# Patient Record
Sex: Female | Born: 1975 | Race: Black or African American | Hispanic: No | Marital: Single | Smoking: Current every day smoker
Health system: Southern US, Community
[De-identification: ages and names within clinical notes are randomized; demographics above are authoritative.]

## PROBLEM LIST (undated history)

## (undated) DIAGNOSIS — I10 Essential (primary) hypertension: Secondary | ICD-10-CM

## (undated) HISTORY — PX: HIP SURGERY: SHX245

---

## 2009-08-18 ENCOUNTER — Emergency Department (HOSPITAL_COMMUNITY): Admission: EM | Admit: 2009-08-18 | Discharge: 2009-08-19 | Payer: Self-pay | Admitting: Emergency Medicine

## 2009-10-21 ENCOUNTER — Emergency Department (HOSPITAL_COMMUNITY): Admission: EM | Admit: 2009-10-21 | Discharge: 2009-10-21 | Payer: Self-pay | Admitting: Emergency Medicine

## 2010-03-11 ENCOUNTER — Ambulatory Visit (HOSPITAL_COMMUNITY): Admission: RE | Admit: 2010-03-11 | Discharge: 2010-03-11 | Payer: Self-pay | Admitting: Obstetrics and Gynecology

## 2010-04-10 ENCOUNTER — Inpatient Hospital Stay (HOSPITAL_COMMUNITY): Admission: AD | Admit: 2010-04-10 | Discharge: 2010-04-10 | Payer: Self-pay | Admitting: Obstetrics and Gynecology

## 2010-05-02 ENCOUNTER — Inpatient Hospital Stay (HOSPITAL_COMMUNITY): Admission: AD | Admit: 2010-05-02 | Discharge: 2010-05-02 | Payer: Self-pay | Admitting: Obstetrics and Gynecology

## 2010-06-02 ENCOUNTER — Inpatient Hospital Stay (HOSPITAL_COMMUNITY)
Admission: AD | Admit: 2010-06-02 | Discharge: 2010-06-06 | Payer: Self-pay | Source: Home / Self Care | Attending: Obstetrics and Gynecology | Admitting: Obstetrics and Gynecology

## 2010-06-03 ENCOUNTER — Encounter (INDEPENDENT_AMBULATORY_CARE_PROVIDER_SITE_OTHER): Payer: Self-pay | Admitting: Obstetrics and Gynecology

## 2010-07-04 ENCOUNTER — Encounter: Payer: Self-pay | Admitting: Obstetrics and Gynecology

## 2010-07-05 NOTE — Discharge Summary (Signed)
NAME:  Nicole Neal, Nicole Neal            ACCOUNT NO.:  [id number]  MEDICAL RECORD NO.:  [id number]          PATIENT TYPE:  INP  LOCATION:  9131                          FACILITY:  [facility]  PHYSICIAN:  Naima A. Dillard, M.D. DATE OF BIRTH:  [birth year]  DATE OF ADMISSION:  06/02/2010 DATE OF DISCHARGE:  06/06/2010                              DISCHARGE SUMMARY   ADMITTING DIAGNOSES: 1. Intrauterine pregnancy at 38-6/7 weeks. 2. Advanced maternal age. 3. Chronic hypertension. 4. Elevated BMI. 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 6. Recent incarceration 7. Anemia. 8. Marijuana use.  DISCHARGE DIAGNOSES: 1. Intrauterine pregnancy at 40 weeks. 2. Chronic hypertension. 3. Fetal bradycardia. 4. Possible abruption. 5. Anemia without hemodynamic instability.  PROCEDURES:  Primary low transverse cesarean section.  HOSPITAL COURSE:  Nicole Neal is a 35 year old gravida 2, para 1-1-0-1 at 38-6/7 weeks who presented on the evening of June 02, 2010, for induction secondary to chronic hypertension with the patient on Aldomet.  Her pregnancy had been remarkable for; 1. Advanced maternal age with normal first trimester screen. 2. Chronic hypertension.  The patient on Aldomet 750 mg p.o. b.i.d. 3. Elevated BMI. 4. History of HSV II, but no recent or current lesions.  The patient     on Valtrex. 5. Recent incarceration 6. Anemia. 7. Marijuana use.  On arrival, the patient was closed, thick and -1 in terms of her cervix. She had no HSV lesions.  Cytotec was placed.  By the next morning, her cervix was 3, 70%, vertex, -2.  Artificial rupture of membranes was accomplished with clear fluid and had a variable deceleration.  At 12:50, a series of variable decelerations.  These responded to usual measures.  The patient was initially 5, then was 7 approximately 30 minutes later.  She then had a prolonged variable deceleration at 2:20 without any recovery.  She was then taken to the OR for a stat  cesarean section.  Findings were a viable female, weight 6 pounds 4 ounces, Apgars were 9 and 9.  There was a small area of clot, less than 10% of placenta.  Infant was taken to the full-term nursery.  Mother was taken to recovery in good condition.  The patient was 7-8 cm at the time of emergent C-section.  Postop day 1, patient was doing well.  She was up ad lib.  Her hemoglobin was 8.8 down from 11.8, white blood cell count was 13.6, her platelet count was 203.  Orthostatics were stable.  She did have a JP drain, was draining approximately 15 mL per shift.  Her blood pressures were in the 109/70.  Aldomet was being held secondary to lower blood pressure.  Social Work consult was obtained secondary to the patient's recent incarceration.  Rest of the patient's hospital stay was essentially uncomplicated.  Infant did well.  The patient was breastfeeding.  Aldomet was restarted.  Urine drug screen was done in light of the patient's previous history.  Results of this were not noted in the patient's chart.  By postop day #3, the patient was doing well. She was up ad lib.  She was on Aldomet 500 mg p.o. b.i.d.  Incision was clean, dry and intact.  Lochia was scant.  Her JP drain was removed without difficulty.  Blood pressure was 110/70.  The patient was afebrile.  She was deemed to receive full benefit of her hospital stay and was discharged home.  DISCHARGE INSTRUCTIONS:  Per [facility] handout.  DISCHARGE MEDICATIONS: 1. Motrin 600 mg p.o. q.6 h. p.r.n. pain. 2. Percocet 5/325 one to two p.o. q.3-4 h. p.r.n. pain. 3. Micronor 1 p.o. daily.  DISCHARGE FOLLOWUP:  Will occur in 6 weeks at [facility] or p.r.n.     Renaldo Reel Emilee Hero, C.N.M.   ______________________________ Pierre Bali Normand Sloop, M.D.    VLL/MEDQ  D:  06/25/2010  T:  06/26/2010  Job:  [id number]  Electronically Signed by Nigel Bridgeman C.N.M. on 06/29/2010 09:25:24 AM Electronically Signed by Jaymes Graff M.D. on 07/05/2010 02:59:55 PM

## 2010-08-23 LAB — CBC
HCT: 35 % — ABNORMAL LOW (ref 36.0–46.0)
Hemoglobin: 8.8 g/dL — ABNORMAL LOW (ref 12.0–15.0)
MCH: 25.2 pg — ABNORMAL LOW (ref 26.0–34.0)
MCH: 25.4 pg — ABNORMAL LOW (ref 26.0–34.0)
MCHC: 33.7 g/dL (ref 30.0–36.0)
MCHC: 33.7 g/dL (ref 30.0–36.0)
Platelets: 203 10*3/uL (ref 150–400)
Platelets: 253 10*3/uL (ref 150–400)
RBC: 4.68 MIL/uL (ref 3.87–5.11)
RDW: 16.1 % — ABNORMAL HIGH (ref 11.5–15.5)
RDW: 16.1 % — ABNORMAL HIGH (ref 11.5–15.5)

## 2010-08-23 LAB — RPR: RPR Ser Ql: NONREACTIVE

## 2010-08-25 LAB — STREP B DNA PROBE: Strep Group B Ag: NEGATIVE

## 2010-08-25 LAB — WET PREP, GENITAL: Trich, Wet Prep: NONE SEEN

## 2010-08-25 LAB — URINALYSIS, ROUTINE W REFLEX MICROSCOPIC
Glucose, UA: NEGATIVE mg/dL
Ketones, ur: 15 mg/dL — AB
Protein, ur: NEGATIVE mg/dL
Urobilinogen, UA: 0.2 mg/dL (ref 0.0–1.0)

## 2010-08-25 LAB — FETAL FIBRONECTIN: Fetal Fibronectin: NEGATIVE

## 2010-08-31 LAB — POCT I-STAT, CHEM 8
Calcium, Ion: 1.17 mmol/L (ref 1.12–1.32)
Creatinine, Ser: 0.5 mg/dL (ref 0.4–1.2)
Glucose, Bld: 90 mg/dL (ref 70–99)
Hemoglobin: 11.9 g/dL — ABNORMAL LOW (ref 12.0–15.0)
TCO2: 25 mmol/L (ref 0–100)

## 2010-08-31 LAB — GC/CHLAMYDIA PROBE AMP, GENITAL: Chlamydia, DNA Probe: NEGATIVE

## 2010-08-31 LAB — URINALYSIS, ROUTINE W REFLEX MICROSCOPIC
Bilirubin Urine: NEGATIVE
Glucose, UA: NEGATIVE mg/dL
Nitrite: NEGATIVE
Protein, ur: NEGATIVE mg/dL
pH: 6 (ref 5.0–8.0)

## 2010-08-31 LAB — CBC
Hemoglobin: 10.7 g/dL — ABNORMAL LOW (ref 12.0–15.0)
MCHC: 31.2 g/dL (ref 30.0–36.0)
MCV: 71.5 fL — ABNORMAL LOW (ref 78.0–100.0)
RBC: 4.8 MIL/uL (ref 3.87–5.11)

## 2010-08-31 LAB — DIFFERENTIAL
Basophils Relative: 1 % (ref 0–1)
Eosinophils Absolute: 0.1 10*3/uL (ref 0.0–0.7)
Monocytes Absolute: 0.8 10*3/uL (ref 0.1–1.0)
Monocytes Relative: 8 % (ref 3–12)

## 2010-09-06 LAB — POCT PREGNANCY, URINE: Preg Test, Ur: NEGATIVE

## 2010-09-06 LAB — URINE MICROSCOPIC-ADD ON

## 2010-09-06 LAB — URINALYSIS, ROUTINE W REFLEX MICROSCOPIC
Glucose, UA: NEGATIVE mg/dL
Hgb urine dipstick: NEGATIVE
Protein, ur: 30 mg/dL — AB

## 2010-09-06 LAB — CBC
Hemoglobin: 11.5 g/dL — ABNORMAL LOW (ref 12.0–15.0)
RBC: 5.55 MIL/uL — ABNORMAL HIGH (ref 3.87–5.11)
RDW: 21.2 % — ABNORMAL HIGH (ref 11.5–15.5)

## 2010-09-06 LAB — GLUCOSE, CAPILLARY: Glucose-Capillary: 140 mg/dL — ABNORMAL HIGH (ref 70–99)

## 2010-09-06 LAB — COMPREHENSIVE METABOLIC PANEL
ALT: 18 U/L (ref 0–35)
Alkaline Phosphatase: 100 U/L (ref 39–117)
Glucose, Bld: 146 mg/dL — ABNORMAL HIGH (ref 70–99)
Potassium: 4.4 mEq/L (ref 3.5–5.1)
Sodium: 137 mEq/L (ref 135–145)
Total Protein: 7.4 g/dL (ref 6.0–8.3)

## 2011-04-03 ENCOUNTER — Inpatient Hospital Stay (HOSPITAL_COMMUNITY)
Admission: AD | Admit: 2011-04-03 | Discharge: 2011-04-03 | Disposition: A | Discharge status: Home / Self Care | Payer: Medicaid Other | Source: Ambulatory Visit | Attending: Obstetrics and Gynecology | Admitting: Obstetrics and Gynecology

## 2011-04-03 ENCOUNTER — Encounter (HOSPITAL_COMMUNITY): Payer: Self-pay | Admitting: *Deleted

## 2011-04-03 DIAGNOSIS — E669 Obesity, unspecified: Secondary | ICD-10-CM

## 2011-04-03 DIAGNOSIS — I10 Essential (primary) hypertension: Secondary | ICD-10-CM

## 2011-04-03 DIAGNOSIS — O209 Hemorrhage in early pregnancy, unspecified: Secondary | ICD-10-CM | POA: Insufficient documentation

## 2011-04-03 DIAGNOSIS — IMO0002 Reserved for concepts with insufficient information to code with codable children: Secondary | ICD-10-CM

## 2011-04-03 HISTORY — DX: Essential (primary) hypertension: I10

## 2011-04-03 LAB — WET PREP, GENITAL
Trich, Wet Prep: NONE SEEN
Yeast Wet Prep HPF POC: NONE SEEN

## 2011-04-03 LAB — DIFFERENTIAL
Lymphocytes Relative: 30 % (ref 12–46)
Lymphs Abs: 2.7 10*3/uL (ref 0.7–4.0)
Neutro Abs: 5.1 10*3/uL (ref 1.7–7.7)
Neutrophils Relative %: 59 % (ref 43–77)

## 2011-04-03 LAB — CBC
MCV: 73.9 fL — ABNORMAL LOW (ref 78.0–100.0)
Platelets: 244 10*3/uL (ref 150–400)
RBC: 5.18 MIL/uL — ABNORMAL HIGH (ref 3.87–5.11)
WBC: 8.8 10*3/uL (ref 4.0–10.5)

## 2011-04-03 LAB — URINALYSIS, ROUTINE W REFLEX MICROSCOPIC
Bilirubin Urine: NEGATIVE
Ketones, ur: NEGATIVE mg/dL
Nitrite: NEGATIVE
Protein, ur: NEGATIVE mg/dL
Urobilinogen, UA: 0.2 mg/dL (ref 0.0–1.0)

## 2011-04-03 NOTE — ED Notes (Signed)
Patient G3P2 [redacted] weeks pregnant positive home test, woke up this morning with some bleeding mainly on toilet tissue, faintly positive UPT in MAU, CNM to put orders in and will come to evaluate patient in MAU

## 2011-04-03 NOTE — Progress Notes (Signed)
Pt reports having positive HPT and woke up this morning and had some vaginal bleeding when she wiped. Denies pain or cramping at this time. Reports having intercourse on Friday

## 2011-04-04 LAB — GC/CHLAMYDIA PROBE AMP, GENITAL: GC Probe Amp, Genital: NEGATIVE

## 2011-05-26 NOTE — ED Provider Notes (Signed)
History   Nicole Neal is a 35 y.o. Obese black female who presents w/ CC of vaginal bleeding this morning.  No cramping or abdominal pain before arriving to MAU, but onset after arrival.  Unplanned pregnancy, but welcomed.  No care prior to today's eval.  LMP=02/20/11.  Last intercourse Friday 04/01/11.  Faint positive UPT on arrival to MAU per RN.  No GI or resp c/o's.  No fever, malaise, chills.  No UTI s/s.  Hx r/f:  1.  Obese  2.  CHTN--no meds currently  3.  HSV II positive blood titers  4.  AMA  5.  H/o chlamydia in past  6.  [place] trait  7.  H/o hip fx  8.  H/o incarceration in past   Pt by herself in MAU.    Chief Complaint  Patient presents with  . Vaginal Bleeding   HPI  OB History    Grav Para Term Preterm Abortions TAB SAB Ect Mult Living   3 2 2       2       Past Medical History  Diagnosis Date  . Hypertension     No past surgical history on file.  No family history on file.  History  Substance Use Topics  . Smoking status: Former Games developer  . Smokeless tobacco: Never Used  . Alcohol Use:     Allergies: No Known Allergies  No prescriptions prior to admission    ROS--see HPI Physical Exam  ..Blood pressure 127/83, pulse 74, temperature 98.7 F (37.1 C), temperature source Oral, resp. rate 18, height 5\' 7"  (1.702 m), weight 145.605 kg (321 lb), last menstrual period 02/20/2011. Physical Exam  Constitutional: She is oriented to person, place, and time. She appears well-developed and well-nourished. No distress.  Cardiovascular: Normal rate.   Respiratory: Effort normal.  GI: Soft. Bowel sounds are normal.  Genitourinary:       SSE:  sm-mod amt of dk red blood in vault; cx:  Closed. No lesions noted.   Difficulty assessing ut size, adnexa secondary to habitus  Neurological: She is alert and oriented to person, place, and time.  Skin: Skin is warm and dry.    MAU Course  Procedures 1.  SSE 2.  Bimanual exam 3.  Wet Prep--moderate clue cells; otherwise  negative 4.  GC/CT--both negative 5.  U/a--WNL except lg Hgb on dip & SG=1.025 6.  CBC w/ diff--WNL:  WBC=8.8; Hgb=12.7; Hct=38.3; Platelets=244; 7.  Quant hcg=24  Assessment and Plan  1. Probable SAB/ low quant hcg =24 2.  7.1 weeks per LMP 3.  Vaginal bleeding 4.  B positive per previous PNR w/ last pregnancy 5.  Obese 6.  AMA 7.  CHTN 8.  BV on wet prep  1.  D/c home w/ bleeding and SAB precautions 2.  Will do f/u quant at office in 48 hrs 3.  Rx for Flagyl 500mg  po bid x7d; Tylenol for cramping 4.  F/u prn Maurico Perrell H 05/26/2011, 5:09 PM

## 2012-07-15 ENCOUNTER — Emergency Department (HOSPITAL_COMMUNITY)
Admission: EM | Admit: 2012-07-15 | Discharge: 2012-07-15 | Disposition: A | Discharge status: Home / Self Care | Payer: Medicaid Other | Attending: Emergency Medicine | Admitting: Emergency Medicine

## 2012-07-15 ENCOUNTER — Encounter (HOSPITAL_COMMUNITY): Payer: Self-pay | Admitting: Emergency Medicine

## 2012-07-15 ENCOUNTER — Emergency Department (HOSPITAL_COMMUNITY): Payer: Medicaid Other

## 2012-07-15 DIAGNOSIS — I10 Essential (primary) hypertension: Secondary | ICD-10-CM | POA: Insufficient documentation

## 2012-07-15 DIAGNOSIS — Y9289 Other specified places as the place of occurrence of the external cause: Secondary | ICD-10-CM | POA: Insufficient documentation

## 2012-07-15 DIAGNOSIS — Z87891 Personal history of nicotine dependence: Secondary | ICD-10-CM | POA: Insufficient documentation

## 2012-07-15 DIAGNOSIS — S83429A Sprain of lateral collateral ligament of unspecified knee, initial encounter: Secondary | ICD-10-CM | POA: Insufficient documentation

## 2012-07-15 DIAGNOSIS — Y9389 Activity, other specified: Secondary | ICD-10-CM | POA: Insufficient documentation

## 2012-07-15 DIAGNOSIS — W010XXA Fall on same level from slipping, tripping and stumbling without subsequent striking against object, initial encounter: Secondary | ICD-10-CM | POA: Insufficient documentation

## 2012-07-15 DIAGNOSIS — S8390XA Sprain of unspecified site of unspecified knee, initial encounter: Secondary | ICD-10-CM

## 2012-07-15 MED ORDER — OXYCODONE-ACETAMINOPHEN 5-325 MG PO TABS
2.0000 | ORAL_TABLET | Freq: Once | ORAL | Status: AC
  Administered 2012-07-15: 2 via ORAL
  Filled 2012-07-15: qty 2

## 2012-07-15 MED ORDER — OXYCODONE-ACETAMINOPHEN 5-325 MG PO TABS: ORAL_TABLET | ORAL | Status: AC

## 2012-07-15 NOTE — Progress Notes (Signed)
Orthopedic Tech Progress Note Patient Details:  Nicole Neal [birth year] [id number]  Ortho Devices Type of Ortho Device: Crutches;Knee Immobilizer Ortho Device/Splint Location: left leg Ortho Device/Splint Interventions: Application   [name] 07/15/2012, 9:42 PM

## 2012-07-15 NOTE — ED Notes (Signed)
Patient reports that she fell while feeding her dog tonight and injured her left knee.  Patient reports that she cannot put weight on her left leg due to the knee pain.

## 2012-07-15 NOTE — ED Notes (Signed)
Fall down feeding his dog. Stated it hurt

## 2012-07-15 NOTE — ED Provider Notes (Signed)
History   This chart was scribed for Wynetta Emery PA-C a non-physician practitioner working with Doug Sou, MD by Lewanda Rife, ED Scribe. This patient was seen in room TR05C/TR05C and the patient's care was started at 9:10 pm.   CSN: [id number]  Arrival date & time 07/15/12  1955   First MD Initiated Contact with Patient 07/15/12 2005      Chief Complaint  Patient presents with  . Knee Injury    (Consider location/radiation/quality/duration/timing/severity/associated sxs/prior treatment) HPI Nicole Neal is a 37 y.o. female who presents to the Emergency Department complaining of constant severe left knee pain onset prior to arrival. Pt reports she tripped over her dog and twisted her left knee. Pt denies any head injury, any other injuries, and numbness. Pt reports normal sensation in left foot. Pt states pain is aggravated when baring weight and movement, and alleviated by nothing. Pt denies taking any pain medications at home to relieve pain.    Past Medical History  Diagnosis Date  . Hypertension     Past Surgical History  Procedure Date  . Hip surgery     History reviewed. No pertinent family history.  History  Substance Use Topics  . Smoking status: Former Games developer  . Smokeless tobacco: Never Used  . Alcohol Use: 0.0 oz/week    4-6 drink(s) per week    OB History    Grav Para Term Preterm Abortions TAB SAB Ect Mult Living   3 2 2       2       Review of Systems  Constitutional: Negative.   HENT: Negative.   Respiratory: Negative.   Cardiovascular: Negative.   Gastrointestinal: Negative.   Musculoskeletal:       Left knee pain   Skin: Negative.   Neurological: Negative.   Hematological: Negative.   Psychiatric/Behavioral: Negative.   All other systems reviewed and are negative.   A complete 10 system review of systems was obtained and all systems are negative except as noted in the HPI and PMH.   Allergies  Review of patient's  allergies indicates no known allergies.  Home Medications   Current Outpatient Rx  Name  Route  Sig  Dispense  Refill  . PRENATAL PLUS 27-1 MG PO TABS   Oral   Take 1 tablet by mouth daily.             BP 136/90  Pulse 94  Temp 98.1 F (36.7 C) (Oral)  Resp 18  SpO2 98%  LMP 07/09/2012  Breastfeeding? Unknown  Physical Exam  Nursing note and vitals reviewed. Constitutional: She is oriented to person, place, and time. She appears well-developed and well-nourished. No distress.  HENT:  Head: Normocephalic.  Eyes: Conjunctivae normal and EOM are normal.  Cardiovascular: Normal rate.   Pulmonary/Chest: Effort normal. No stridor.  Musculoskeletal: Normal range of motion.       Left knee: She exhibits no LCL laxity and no MCL laxity. tenderness found. Patellar tendon tenderness noted. No medial joint line and no lateral joint line tenderness noted.       Mildly reduced ROM secondary to pain   Left Knee: No deformity, erythema or abrasions. No effusion or crepitance. Anterior and posterior drawer show no abnormal laxity. Stable to valgus and varus stress. Joint lines are non-tender. Neurovascularly intact. Pt ambulates with an antalgic gait.   Neurological: She is alert and oriented to person, place, and time.  Skin: Skin is warm and dry.  Psychiatric: She has a  normal mood and affect.    ED Course  Procedures (including critical care time)  Medications  oxyCODONE-acetaminophen (PERCOCET/ROXICET) 5-325 MG per tablet (not administered)  oxyCODONE-acetaminophen (PERCOCET/ROXICET) 5-325 MG per tablet 2 tablet (2 tablet Oral Given 07/15/12 2130)    Labs Reviewed - No data to display Dg Knee Complete 4 Views Left  07/15/2012  *RADIOLOGY REPORT*  Clinical Data: Anterior knee pain after fall.  LEFT KNEE - COMPLETE 4+ VIEW  Comparison: None.  Findings: Grid cut off artifact on the AP view.  There are degenerative changes in the left knee involving all three compartments with medial  compartment narrowing and patellofemoral joint hypertrophic changes most prominent.  There is a focal calcification projected over the posterior aspect of the lateral compartment which may represent loose body.  No focal donor site is identified.  No significant effusion.  No evidence of acute fracture or subluxation.  No radiopaque soft tissue foreign bodies.  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  Possible loose body in the lateral compartment.  No acute fractures.   Original Report Authenticated By: Burman Nieves, M.D.      1. Knee sprain       MDM  No Fracture or ligamentous transection. Pt will be given Knee immobilizer and Crutches.    Filed Vitals:   07/15/12 2003 07/15/12 2157  BP: 136/90   Pulse: 94   Temp: 98.1 F (36.7 C)   TempSrc: Oral   Resp: 18   SpO2: 98% 99%     Pt verbalized understanding and agrees with care plan. Outpatient follow-up and return precautions given.    New Prescriptions   OXYCODONE-ACETAMINOPHEN (PERCOCET/ROXICET) 5-325 MG PER TABLET    1 to 2 tabs PO q6hrs  PRN for pain    I personally performed the services described in this documentation, which was scribed in my presence. The recorded information has been reviewed and is accurate.    Wynetta Emery, PA-C 07/16/12 1229

## 2012-07-17 NOTE — ED Provider Notes (Signed)
Medical screening examination/treatment/procedure(s) were performed by non-physician practitioner and as supervising physician I was immediately available for consultation/collaboration.  Doug Sou, MD 07/17/12 1330

## 2014-04-14 ENCOUNTER — Encounter (HOSPITAL_COMMUNITY): Payer: Self-pay | Admitting: Emergency Medicine

## 2014-07-03 IMAGING — CR DG KNEE COMPLETE 4+V*L*
5 series · 5 of 5 positions shown · non-contrast
Comparison: None.

CLINICAL DATA: Anterior knee pain after fall.

LEFT KNEE - COMPLETE 4+ VIEW

[t knee ap left]
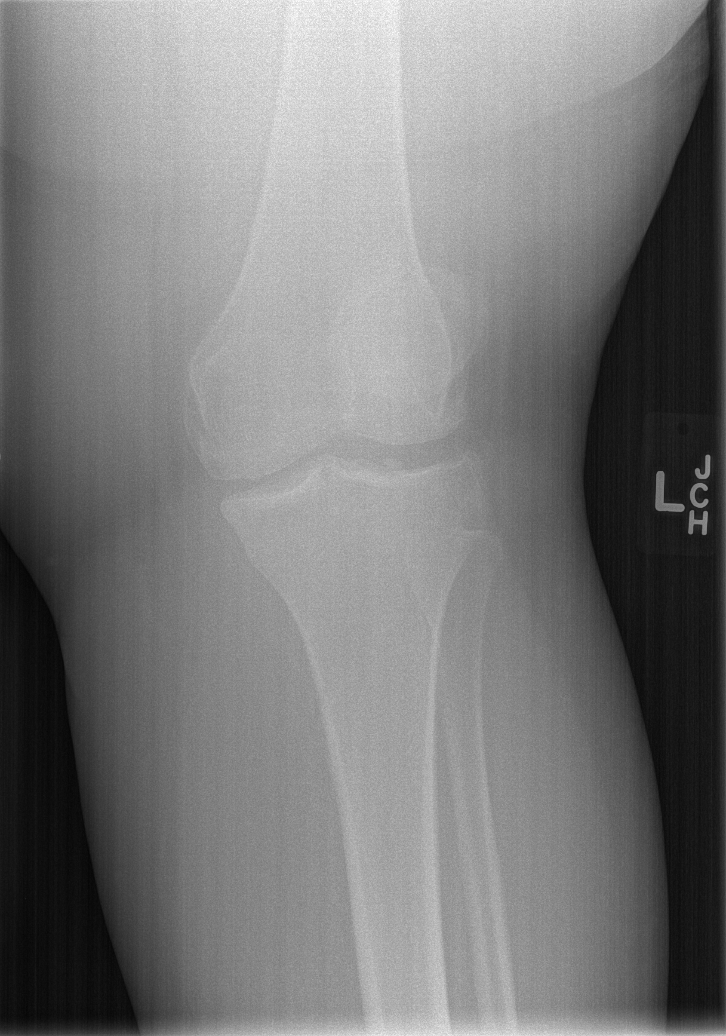

[t knee ap left *]
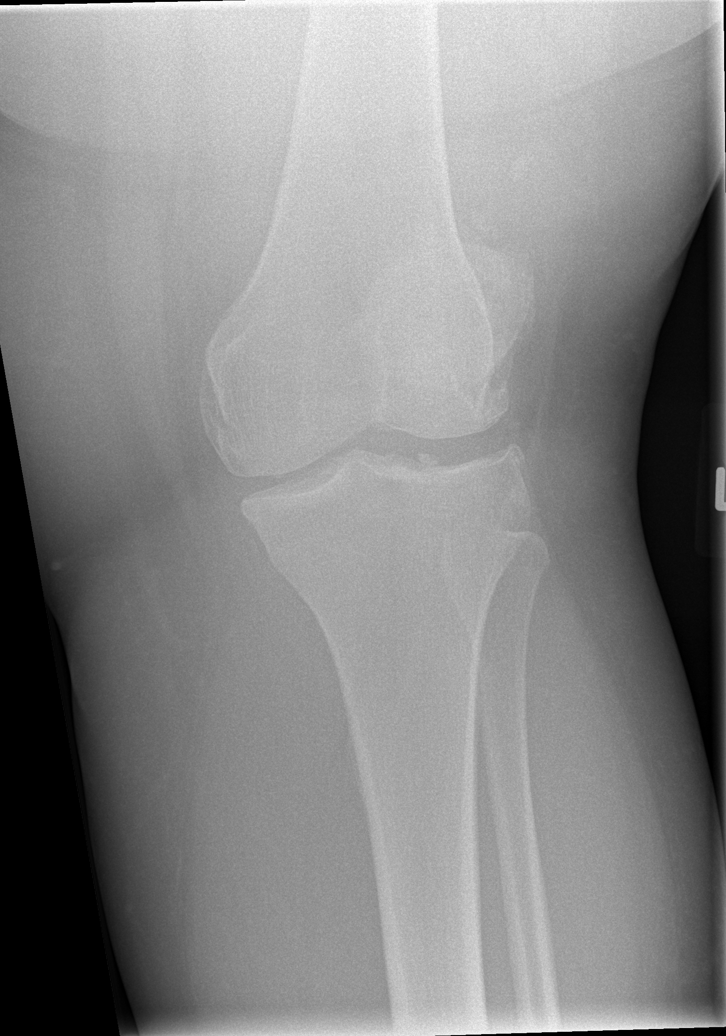

[t knee oblique left * (1 of 2)]
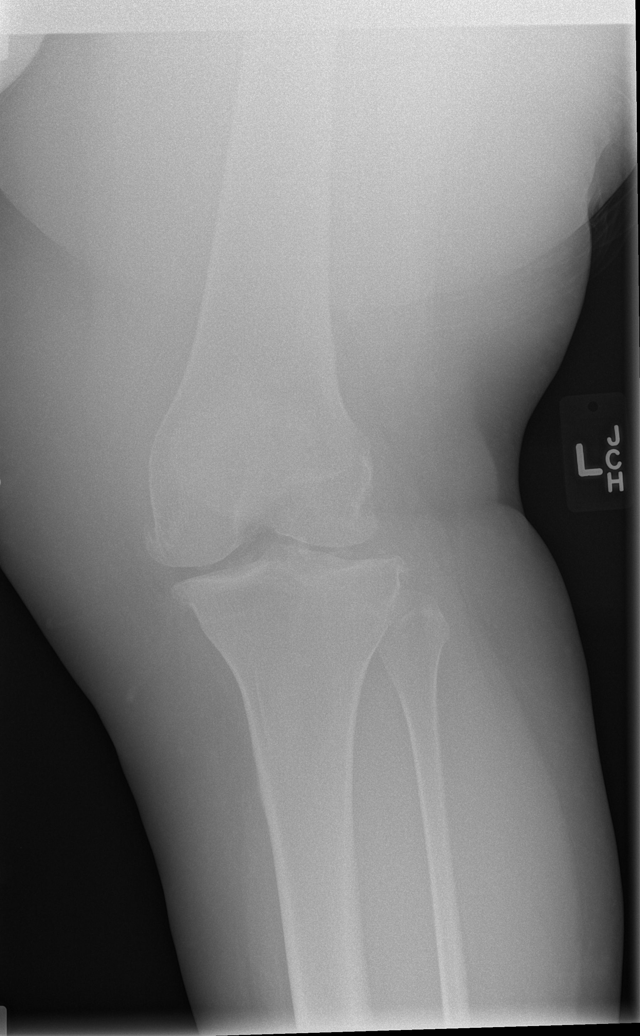

[t knee oblique left * (2 of 2)]
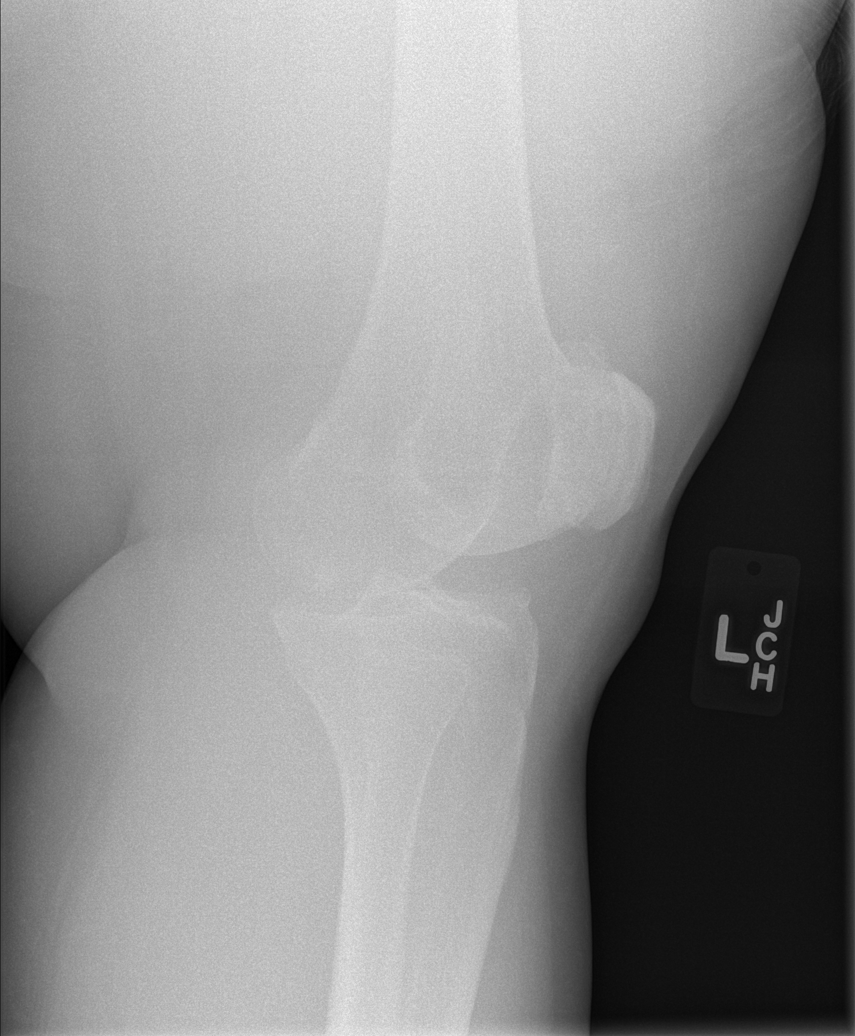

[t knee lat left *]
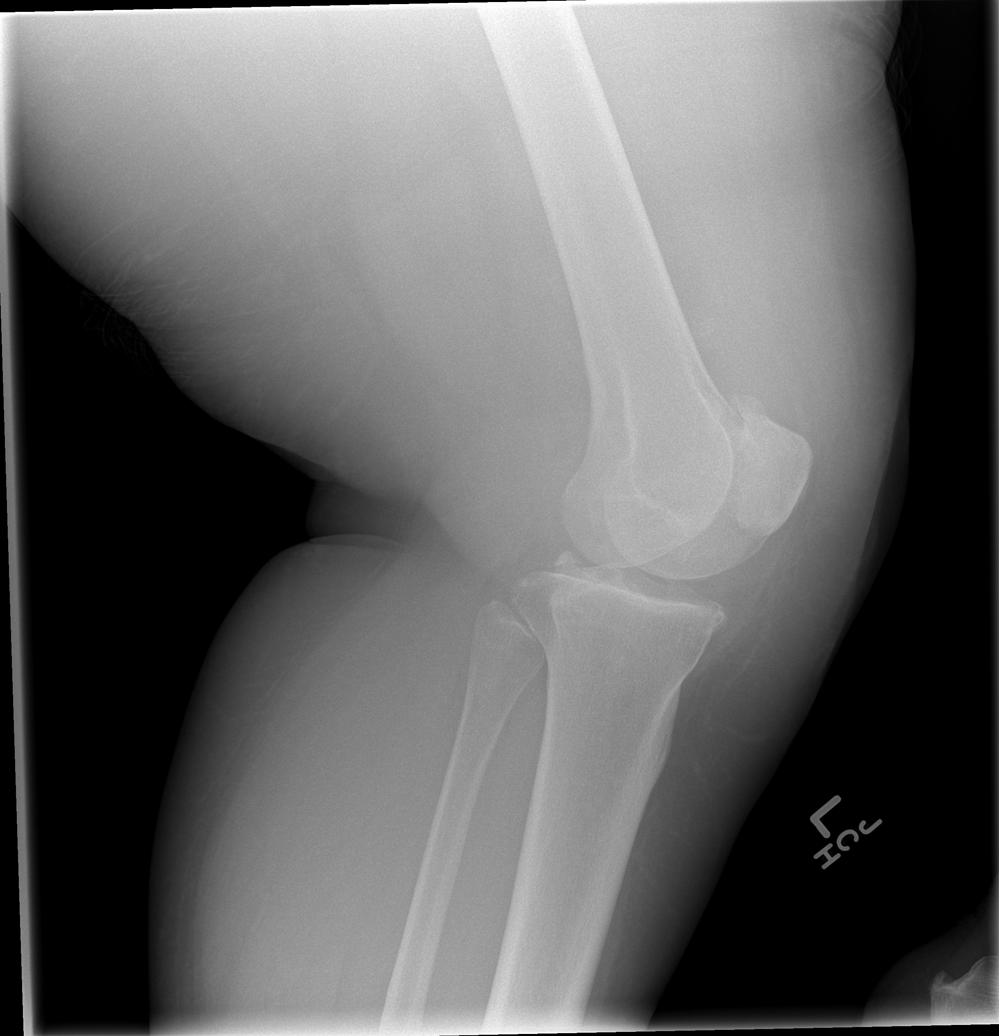

[5 of 5 positions shown; findings below may reference images not displayed]

FINDINGS: Grid cut off artifact on the AP view.  There are
degenerative changes in the left knee involving all three
compartments with medial compartment narrowing and patellofemoral
joint hypertrophic changes most prominent.  There is a focal
calcification projected over the posterior aspect of the lateral
compartment which may represent loose body.  No focal donor site is
identified.  No significant effusion.  No evidence of acute
fracture or subluxation.  No radiopaque soft tissue foreign bodies.
IMPRESSION: Degenerative changes in the left knee.  Possible loose body in the
lateral compartment.  No acute fractures.

## 2016-05-12 ENCOUNTER — Encounter (HOSPITAL_COMMUNITY): Payer: Self-pay | Admitting: *Deleted

## 2016-05-12 DIAGNOSIS — I10 Essential (primary) hypertension: Secondary | ICD-10-CM | POA: Diagnosis not present

## 2016-05-12 DIAGNOSIS — Z79899 Other long term (current) drug therapy: Secondary | ICD-10-CM | POA: Insufficient documentation

## 2016-05-12 DIAGNOSIS — N898 Other specified noninflammatory disorders of vagina: Secondary | ICD-10-CM | POA: Diagnosis present

## 2016-05-12 DIAGNOSIS — F17200 Nicotine dependence, unspecified, uncomplicated: Secondary | ICD-10-CM | POA: Diagnosis not present

## 2016-05-12 DIAGNOSIS — N760 Acute vaginitis: Secondary | ICD-10-CM | POA: Diagnosis not present

## 2016-05-12 LAB — CBC
HEMATOCRIT: 35.7 % — AB (ref 36.0–46.0)
HEMOGLOBIN: 11.4 g/dL — AB (ref 12.0–15.0)
MCH: 22.3 pg — ABNORMAL LOW (ref 26.0–34.0)
MCHC: 31.9 g/dL (ref 30.0–36.0)
MCV: 69.7 fL — AB (ref 78.0–100.0)
Platelets: 311 10*3/uL (ref 150–400)
RBC: 5.12 MIL/uL — AB (ref 3.87–5.11)
RDW: 19.8 % — ABNORMAL HIGH (ref 11.5–15.5)
WBC: 11.6 10*3/uL — AB (ref 4.0–10.5)

## 2016-05-12 LAB — URINALYSIS, ROUTINE W REFLEX MICROSCOPIC
Bilirubin Urine: NEGATIVE
GLUCOSE, UA: NEGATIVE mg/dL
Hgb urine dipstick: NEGATIVE
KETONES UR: NEGATIVE mg/dL
LEUKOCYTES UA: NEGATIVE
NITRITE: NEGATIVE
PH: 5.5 (ref 5.0–8.0)
Protein, ur: NEGATIVE mg/dL
SPECIFIC GRAVITY, URINE: 1.031 — AB (ref 1.005–1.030)

## 2016-05-12 LAB — COMPREHENSIVE METABOLIC PANEL
ALT: 19 U/L (ref 14–54)
ANION GAP: 12 (ref 5–15)
AST: 26 U/L (ref 15–41)
Albumin: 3.7 g/dL (ref 3.5–5.0)
Alkaline Phosphatase: 98 U/L (ref 38–126)
BILIRUBIN TOTAL: 0.2 mg/dL — AB (ref 0.3–1.2)
BUN: 11 mg/dL (ref 6–20)
CHLORIDE: 103 mmol/L (ref 101–111)
CO2: 22 mmol/L (ref 22–32)
Calcium: 9 mg/dL (ref 8.9–10.3)
Creatinine, Ser: 0.82 mg/dL (ref 0.44–1.00)
GFR calc Af Amer: >60 mL/min (ref >60)
Glucose, Bld: 128 mg/dL — ABNORMAL HIGH (ref 65–99)
POTASSIUM: 3.7 mmol/L (ref 3.5–5.1)
Sodium: 137 mmol/L (ref 135–145)
TOTAL PROTEIN: 7.4 g/dL (ref 6.5–8.1)

## 2016-05-12 NOTE — ED Triage Notes (Signed)
Pt c/o vaginal discharge and back pain x 1 week

## 2016-05-13 ENCOUNTER — Emergency Department (HOSPITAL_COMMUNITY)
Admission: EM | Admit: 2016-05-13 | Discharge: 2016-05-13 | Disposition: A | Discharge status: Home / Self Care | Payer: Medicaid Other | Attending: Emergency Medicine | Admitting: Emergency Medicine

## 2016-05-13 DIAGNOSIS — B9689 Other specified bacterial agents as the cause of diseases classified elsewhere: Secondary | ICD-10-CM

## 2016-05-13 DIAGNOSIS — N760 Acute vaginitis: Secondary | ICD-10-CM

## 2016-05-13 LAB — HIV ANTIBODY (ROUTINE TESTING W REFLEX): HIV SCREEN 4TH GENERATION: NONREACTIVE

## 2016-05-13 LAB — GC/CHLAMYDIA PROBE AMP (~~LOC~~) NOT AT ARMC
CHLAMYDIA, DNA PROBE: POSITIVE — AB
Neisseria Gonorrhea: NEGATIVE

## 2016-05-13 LAB — RPR: RPR Ser Ql: NONREACTIVE

## 2016-05-13 LAB — WET PREP, GENITAL
SPERM: NONE SEEN
Trich, Wet Prep: NONE SEEN
Yeast Wet Prep HPF POC: NONE SEEN

## 2016-05-13 MED ORDER — CEFTRIAXONE SODIUM 250 MG IJ SOLR
250.0000 mg | Freq: Once | INTRAMUSCULAR | Status: AC
  Administered 2016-05-13: 250 mg via INTRAMUSCULAR
  Filled 2016-05-13: qty 250

## 2016-05-13 MED ORDER — METRONIDAZOLE 0.75 % VA GEL: 1.0000 | Freq: Two times a day (BID) | VAGINAL | 0 refills | Status: AC

## 2016-05-13 MED ORDER — IBUPROFEN 600 MG PO TABS: 600.0000 mg | ORAL_TABLET | Freq: Four times a day (QID) | ORAL | 0 refills | Status: AC | PRN

## 2016-05-13 MED ORDER — AZITHROMYCIN 250 MG PO TABS
1000.0000 mg | ORAL_TABLET | Freq: Once | ORAL | Status: AC
Start: 2016-05-13 — End: 2016-05-13
  Administered 2016-05-13: 1000 mg via ORAL
  Filled 2016-05-13: qty 4

## 2016-05-13 MED ORDER — HYDROCODONE-ACETAMINOPHEN 5-325 MG PO TABS
1.0000 | ORAL_TABLET | Freq: Once | ORAL | Status: AC
  Administered 2016-05-13: 1 via ORAL
  Filled 2016-05-13: qty 1

## 2016-05-13 MED ORDER — STERILE WATER FOR INJECTION IJ SOLN
INTRAMUSCULAR | Status: AC
  Administered 2016-05-13: 10 mL
  Filled 2016-05-13: qty 10

## 2016-05-13 NOTE — ED Provider Notes (Signed)
[facility]-EMERGENCY DEPT Provider Note   CSN: [id number] Arrival date & time: 05/12/16  2221     History   Chief Complaint Chief Complaint  Patient presents with  . Abdominal Pain    HPI Nicole Neal is a 40 y.o. female.  Patient presents with complaint of low back pain and vaginal discharge. She states she is concerned for exposure to STD. No fever, nausea, vomiting or urinary symptoms. She is having regular menses without change. Symptoms started approximately one week ago.   The history is provided by the patient. No language interpreter was used.  Abdominal Pain   Pertinent negatives include fever, nausea, vomiting and dysuria.    Past Medical History:  Diagnosis Date  . Hypertension     There are no active problems to display for this patient.   Past Surgical History:  Procedure Laterality Date  . HIP SURGERY      OB History    Gravida Para Term Preterm AB Living   3 2 2     2    SAB TAB Ectopic Multiple Live Births                   Home Medications    Prior to Admission medications   Medication Sig Start Date End Date Taking? Authorizing Provider  oxyCODONE-acetaminophen (PERCOCET/ROXICET) 5-325 MG per tablet 1 to 2 tabs PO q6hrs  PRN for pain 07/15/12   Joni ReiningNicole Pisciotta, PA-C  prenatal vitamin w/FE, FA (PRENATAL 1 + 1) 27-1 MG TABS Take 1 tablet by mouth daily.      Historical Provider, MD    Family History No family history on file.  Social History Social History  Substance Use Topics  . Smoking status: Current Every Day Smoker  . Smokeless tobacco: Never Used  . Alcohol use 0.0 oz/week    4 - 6 Standard drinks or equivalent per week     Allergies   Patient has no known allergies.   Review of Systems Review of Systems  Constitutional: Negative for fever.  Respiratory: Negative for shortness of breath.   Cardiovascular: Negative for chest pain.  Gastrointestinal: Positive for abdominal pain. Negative for nausea and vomiting.    Genitourinary: Positive for vaginal discharge. Negative for difficulty urinating and dysuria.  Skin: Negative for rash.  Neurological: Negative for weakness.     Physical Exam Updated Vital Signs BP 119/63   Pulse 89   Temp 98.9 F (37.2 C) (Oral)   Resp 20   LMP 04/18/2016   SpO2 100%   Physical Exam  Constitutional: She is oriented to person, place, and time. She appears well-developed and well-nourished.  Neck: Normal range of motion.  Pulmonary/Chest: Effort normal.  Abdominal: Soft. She exhibits no mass. There is no tenderness. There is no guarding.  Genitourinary: Vagina normal.  Genitourinary Comments: Clear vaginal discharge present. No purulence. No CMT, adnexal mass or tenderness.   Neurological: She is alert and oriented to person, place, and time.  Skin: Skin is warm and dry.     ED Treatments / Results  Labs (all labs ordered are listed, but only abnormal results are displayed) Labs Reviewed  COMPREHENSIVE METABOLIC PANEL - Abnormal; Notable for the following:       Result Value   Glucose, Bld 128 (*)    Total Bilirubin 0.2 (*)    All other components within normal limits  CBC - Abnormal; Notable for the following:    WBC 11.6 (*)    RBC 5.12 (*)  Hemoglobin 11.4 (*)    HCT 35.7 (*)    MCV 69.7 (*)    MCH 22.3 (*)    RDW 19.8 (*)    All other components within normal limits  URINALYSIS, ROUTINE W REFLEX MICROSCOPIC (NOT [facility]) - Abnormal; Notable for the following:    APPearance CLOUDY (*)    Specific Gravity, Urine 1.031 (*)    All other components within normal limits  WET PREP, GENITAL  RPR  HIV ANTIBODY (ROUTINE TESTING)  POC URINE PREG, ED  GC/CHLAMYDIA PROBE AMP ([place]) NOT AT [facility]    EKG  EKG Interpretation None       Radiology No results found.  Procedures Procedures (including critical care time)  Medications Ordered in ED Medications  HYDROcodone-acetaminophen (NORCO/VICODIN) 5-325 MG per tablet 1 tablet (not  administered)     Initial Impression / Assessment and Plan / ED Course  I have reviewed the triage vital signs and the nursing notes.  Pertinent labs & imaging results that were available during my care of the patient were reviewed by me and considered in my medical decision making (see chart for details).  Clinical Course    Patient with complaint of vaginal discharge and low back pain. No finding on exam concerning for infection. Offered STD treatment and patient accepts. Rocephin and Zithromax provided.   Referrals made for outpatient GYN and PCP.   Final Clinical Impressions(s) / ED Diagnoses   Final diagnoses:  None   1. BV  New Prescriptions New Prescriptions   No medications on file     Elpidio AnisShari Janaysha Depaulo, PA-C 05/13/16 0344    Tomasita CrumbleAdeleke Oni, MD 05/13/16 [phone]

## 2016-05-17 ENCOUNTER — Encounter (HOSPITAL_COMMUNITY): Payer: Self-pay | Admitting: Emergency Medicine
